# Patient Record
Sex: Female | Born: 1975 | Race: White | Hispanic: No | Marital: Married | Smoking: Never smoker
Health system: Southern US, Community
[De-identification: ages and names within clinical notes are randomized; demographics above are authoritative.]

## PROBLEM LIST (undated history)

## (undated) DIAGNOSIS — Z789 Other specified health status: Secondary | ICD-10-CM

## (undated) HISTORY — PX: NO PAST SURGERIES: SHX2092

## (undated) HISTORY — DX: Other specified health status: Z78.9

---

## 2019-05-16 ENCOUNTER — Ambulatory Visit (INDEPENDENT_AMBULATORY_CARE_PROVIDER_SITE_OTHER): Payer: 59 | Admitting: Advanced Practice Midwife

## 2019-05-16 ENCOUNTER — Other Ambulatory Visit: Payer: Self-pay

## 2019-05-16 ENCOUNTER — Other Ambulatory Visit (HOSPITAL_COMMUNITY)
Admission: RE | Admit: 2019-05-16 | Discharge: 2019-05-16 | Disposition: A | Discharge status: Home / Self Care | Payer: 59 | Source: Ambulatory Visit | Attending: Advanced Practice Midwife | Admitting: Advanced Practice Midwife

## 2019-05-16 ENCOUNTER — Encounter: Payer: Self-pay | Admitting: Advanced Practice Midwife

## 2019-05-16 VITALS — BP 104/64 | Ht 66.0 in | Wt 149.0 lb

## 2019-05-16 DIAGNOSIS — Z124 Encounter for screening for malignant neoplasm of cervix: Secondary | ICD-10-CM

## 2019-05-16 DIAGNOSIS — Z01419 Encounter for gynecological examination (general) (routine) without abnormal findings: Secondary | ICD-10-CM | POA: Diagnosis present

## 2019-05-16 DIAGNOSIS — N6315 Unspecified lump in the right breast, overlapping quadrants: Secondary | ICD-10-CM

## 2019-05-16 NOTE — Progress Notes (Signed)
Gynecology Annual Exam  PCP: Patient, No Pcp Per  Chief Complaint:  Chief Complaint  Patient presents with  . Gynecologic Exam    Rigth breast lump, rather large    History of Present Illness: Patient is a 43 y.o. G4P4 presents for annual exam. The patient has complaint today of right breast lump. She has noticed the lump for some time- unsure exactly- but in the past week noticed it being larger. She denies trauma or tenderness. She denies any discharge from her nipple. She has no other concerns today.   She has not had any bleeding while on Mirena- current IUD placed in 2017. She has no concerns with the IUD.   LMP: No LMP recorded. (Menstrual status: IUD).  Postcoital Bleeding: no Dysmenorrhea: not applicable   The patient is sexually active. She currently uses IUD for contraception. She denies dyspareunia.  The patient does perform self breast exams.  There is no notable family history of breast or ovarian cancer in her family.  The patient wears seatbelts: yes.   The patient has regular exercise: She runs 3-4 miles daily and also works out on [employer]. She admits healthy diet and adequate hydration and sleep.    The patient denies current symptoms of depression.    Review of Systems: Review of Systems  Constitutional: Negative.   HENT: Negative.   Eyes: Negative.   Respiratory: Negative.   Cardiovascular: Negative.   Gastrointestinal: Negative.   Genitourinary: Negative.   Musculoskeletal: Negative.   Skin: Negative.   Neurological: Negative.   Endo/Heme/Allergies: Negative.   Psychiatric/Behavioral: Negative.   Breast: lump in right breast  Past Medical History:  Past Medical History:  Diagnosis Date  . No pertinent past medical history     Past Surgical History:  Past Surgical History:  Procedure Laterality Date  . NO PAST SURGERIES      Gynecologic History:  No LMP recorded. (Menstrual status: IUD). Contraception: IUD Last Pap: 3 years ago  Results were:  no abnormalities  Last mammogram: has never had a mammogram  Obstetric History: 884P4  Family History:  History reviewed. No pertinent family history.  Social History:  Social History   Socioeconomic History  . Marital status: Married    Spouse name: Not on file  . Number of children: Not on file  . Years of education: Not on file  . Highest education level: Not on file  Occupational History  . Occupation: [employer]  Social Needs  . Financial resource strain: Not on file  . Food insecurity    Worry: Not on file    Inability: Not on file  . Transportation needs    Medical: Not on file    Non-medical: Not on file  Tobacco Use  . Smoking status: Never Smoker  . Smokeless tobacco: Never Used  Substance and Sexual Activity  . Alcohol use: Yes    Comment: Rare  . Drug use: Never  . Sexual activity: Yes    Partners: Male    Birth control/protection: I.U.D.  Lifestyle  . Physical activity    Days per week: Not on file    Minutes per session: Not on file  . Stress: Not on file  Relationships  . Social Musicianconnections    Talks on phone: Not on file    Gets together: Not on file    Attends religious service: Not on file    Active member of club or organization: Not on file    Attends meetings of clubs  or organizations: Not on file    Relationship status: Not on file  . Intimate partner violence    Fear of current or ex partner: Not on file    Emotionally abused: Not on file    Physically abused: Not on file    Forced sexual activity: Not on file  Other Topics Concern  . Not on file  Social History Narrative  . Not on file    Allergies:  No Known Allergies  Medications: Prior to Admission medications   Medication Sig Start Date End Date Taking? Authorizing Provider  levonorgestrel (MIRENA) 20 MCG/24HR IUD 1 each by Intrauterine route once.   Yes [provider]    Physical Exam Vitals: Blood pressure 104/64, height 5\' 6"  (1.676 m), weight  149 lb (67.6 kg).  General: NAD HEENT: normocephalic, anicteric Thyroid: no enlargement, no palpable nodules Pulmonary: No increased work of breathing, CTAB Cardiovascular: RRR, distal pulses 2+ Breast: Right breast: 4cm x 2cm nodule just under and superior to nipple, non- tender to palpation, mobile, nipple is pointing down, no nipple discharge. Left breast: no palpable nodules, no tenderness, no nipple discharge.  No axillary or supraclavicular lymphadenopathy. Abdomen: NABS, soft, non-tender, non-distended.  Umbilicus without lesions.  No hepatomegaly, splenomegaly or masses palpable. No evidence of hernia  Genitourinary:  External: Normal external female genitalia.  Normal urethral meatus, normal Bartholin's and Skene's glands.    Vagina: Normal vaginal mucosa, no evidence of prolapse.    Cervix: Grossly normal in appearance, no bleeding, no CMT, strings visible appear to be 3 cm  Uterus: Non-enlarged, mobile, normal contour.    Adnexa: ovaries non-enlarged, no adnexal masses  Rectal: deferred  Lymphatic: no evidence of inguinal lymphadenopathy Extremities: no edema, erythema, or tenderness Neurologic: Grossly intact Psychiatric: mood appropriate, affect full    Assessment: 43 y.o. G4P4 routine annual exam  Plan: Problem List Items Addressed This Visit    None    Visit Diagnoses    Well woman exam with routine gynecological exam    -  Primary   Relevant Orders   Cytology - PAP   MM DIAG BREAST TOMO BILATERAL   US BREAST LTD UNI LEFT INC AXILLA   US BREAST LTD UNI RIGHT INC AXILLA   Cervical cancer screening       Relevant Orders   Cytology - PAP   Breast lump on right side at 12 o'clock position       Relevant Orders   MM DIAG BREAST TOMO BILATERAL   US BREAST LTD UNI LEFT INC AXILLA   US BREAST LTD UNI RIGHT INC AXILLA      1) Mammogram - recommend yearly screening mammogram.  1) Mammogram: diagnostic mammogram/bilateral u/s was ordered today  2) STI screening   was offered and declined  3) ASCCP guidelines and rationale discussed.  Patient opts for every 3 years screening interval  4) Contraception - the patient is currently using  IUD.  She is happy with her current form of contraception and plans to continue  5) Colonoscopy -- Screening recommended starting at age 26 for average risk individuals, age 65 for individuals deemed at increased risk (including African Americans) and recommended to continue until age 68.  For patient age 15-85 individualized approach is recommended.  Gold standard screening is via colonoscopy, Cologuard screening is an acceptable alternative for patient unwilling or unable to undergo colonoscopy.  "Colorectal cancer screening for average?risk adults: 2018 guideline update from the American Cancer Society"CA: A Cancer Journal for Clinicians:  Jan 06, 2017   6) Routine healthcare maintenance including cholesterol, diabetes screening discussed Declines  7) Return in about 1 year (around 05/15/2020) for annual established gyn.   Tresea Mall, CNM [facility] 05/16/2019, 4:38 PM

## 2019-05-17 ENCOUNTER — Telehealth: Payer: Self-pay | Admitting: Advanced Practice Midwife

## 2019-05-17 NOTE — Telephone Encounter (Signed)
Called and spoke with patient, she is aware of her appointment at [facility] on Tuesday May 23, 2019 @ 2:40pm.

## 2019-05-23 ENCOUNTER — Ambulatory Visit
Admission: RE | Admit: 2019-05-23 | Discharge: 2019-05-23 | Disposition: A | Discharge status: Home / Self Care | Payer: 59 | Source: Ambulatory Visit | Attending: Advanced Practice Midwife | Admitting: Advanced Practice Midwife

## 2019-05-23 DIAGNOSIS — N6315 Unspecified lump in the right breast, overlapping quadrants: Secondary | ICD-10-CM

## 2019-05-23 DIAGNOSIS — Z01419 Encounter for gynecological examination (general) (routine) without abnormal findings: Secondary | ICD-10-CM

## 2019-05-23 LAB — CYTOLOGY - PAP
Comment: NEGATIVE
Diagnosis: NEGATIVE
High risk HPV: NEGATIVE

## 2019-05-24 ENCOUNTER — Other Ambulatory Visit: Payer: Self-pay | Admitting: Advanced Practice Midwife

## 2019-05-24 DIAGNOSIS — R928 Other abnormal and inconclusive findings on diagnostic imaging of breast: Secondary | ICD-10-CM

## 2019-05-24 DIAGNOSIS — N6310 Unspecified lump in the right breast, unspecified quadrant: Secondary | ICD-10-CM

## 2019-05-24 DIAGNOSIS — N6001 Solitary cyst of right breast: Secondary | ICD-10-CM

## 2019-05-24 DIAGNOSIS — R921 Mammographic calcification found on diagnostic imaging of breast: Secondary | ICD-10-CM

## 2019-06-02 ENCOUNTER — Other Ambulatory Visit: Payer: Self-pay | Admitting: Advanced Practice Midwife

## 2019-06-02 ENCOUNTER — Ambulatory Visit
Admission: RE | Admit: 2019-06-02 | Discharge: 2019-06-02 | Disposition: A | Discharge status: Home / Self Care | Payer: 59 | Source: Ambulatory Visit | Attending: Advanced Practice Midwife | Admitting: Advanced Practice Midwife

## 2019-06-02 DIAGNOSIS — N6001 Solitary cyst of right breast: Secondary | ICD-10-CM | POA: Insufficient documentation

## 2019-06-02 DIAGNOSIS — R928 Other abnormal and inconclusive findings on diagnostic imaging of breast: Secondary | ICD-10-CM

## 2019-06-02 DIAGNOSIS — R921 Mammographic calcification found on diagnostic imaging of breast: Secondary | ICD-10-CM

## 2019-06-02 DIAGNOSIS — N6019 Diffuse cystic mastopathy of unspecified breast: Secondary | ICD-10-CM

## 2019-06-02 DIAGNOSIS — N6310 Unspecified lump in the right breast, unspecified quadrant: Secondary | ICD-10-CM

## 2019-06-02 HISTORY — PX: BREAST BIOPSY: SHX20

## 2019-06-02 HISTORY — DX: Diffuse cystic mastopathy of unspecified breast: N60.19

## 2019-06-06 LAB — SURGICAL PATHOLOGY

## 2020-11-05 ENCOUNTER — Other Ambulatory Visit: Payer: Self-pay

## 2020-11-05 ENCOUNTER — Other Ambulatory Visit: Payer: Self-pay | Admitting: Chiropractic Medicine

## 2020-11-05 DIAGNOSIS — M9903 Segmental and somatic dysfunction of lumbar region: Secondary | ICD-10-CM

## 2020-11-05 DIAGNOSIS — M5442 Lumbago with sciatica, left side: Secondary | ICD-10-CM

## 2020-11-05 DIAGNOSIS — M62830 Muscle spasm of back: Secondary | ICD-10-CM

## 2020-11-07 ENCOUNTER — Other Ambulatory Visit: Payer: Self-pay

## 2020-11-07 ENCOUNTER — Ambulatory Visit
Admission: RE | Admit: 2020-11-07 | Discharge: 2020-11-07 | Disposition: A | Discharge status: Home / Self Care | Payer: 59 | Source: Ambulatory Visit | Attending: Chiropractic Medicine | Admitting: Chiropractic Medicine

## 2020-11-07 DIAGNOSIS — M62830 Muscle spasm of back: Secondary | ICD-10-CM | POA: Insufficient documentation

## 2020-11-07 DIAGNOSIS — M9903 Segmental and somatic dysfunction of lumbar region: Secondary | ICD-10-CM | POA: Insufficient documentation

## 2020-11-07 DIAGNOSIS — M5442 Lumbago with sciatica, left side: Secondary | ICD-10-CM | POA: Diagnosis present

## 2023-02-01 IMAGING — MR MR LUMBAR SPINE W/O CM
5 series · 32 of 48 positions shown · non-contrast
Comparison: None.

CLINICAL DATA: Low back pain with left leg pain and numbness for
the past month.

EXAM:
MRI LUMBAR SPINE WITHOUT CONTRAST
TECHNIQUE: Multiplanar, multisequence MR imaging of the lumbar spine was
performed. No intravenous contrast was administered.

[Series 5: T2 · sagittal · 4.0mm · 0.81mm/px · 7 of 17 slices shown (1 of 2)]
[im 1/17]
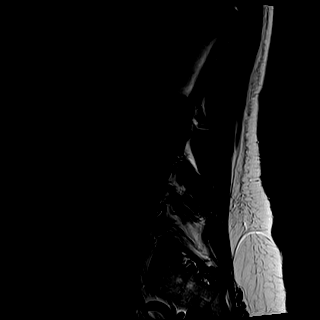
[im 3/17]
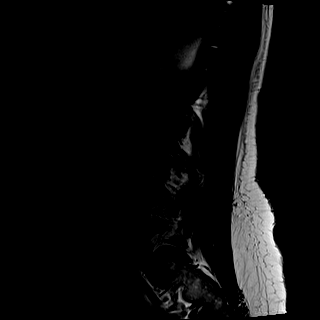
[im 6/17]
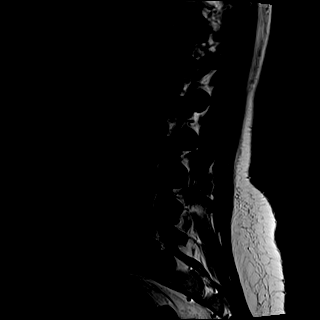
[im 9/17]
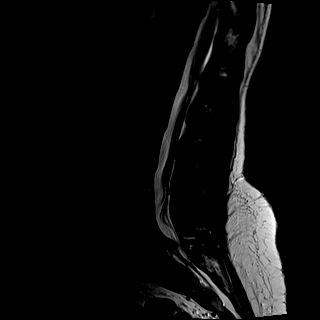
[im 11/17]
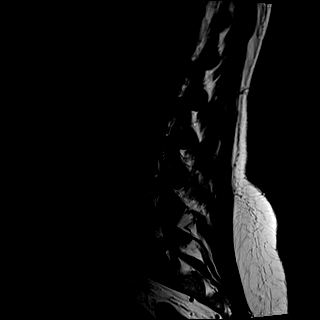
[im 14/17]
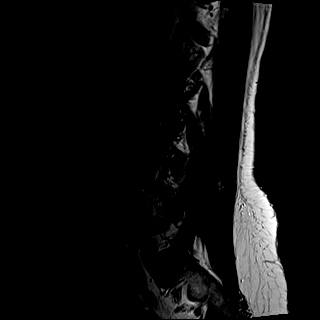
[im 17/17]
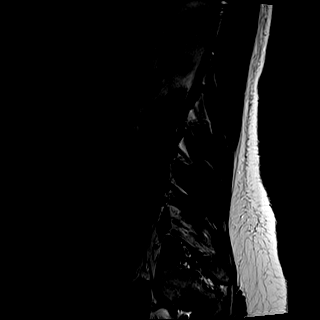

[Series 6: T1 · sagittal · 4.0mm · 0.81mm/px · 7 of 17 slices shown (1 of 2)]
[im 1/17]
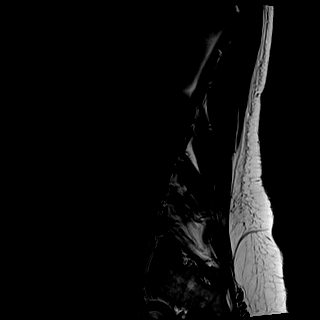
[im 3/17]
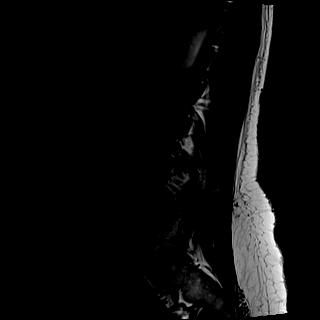
[im 6/17]
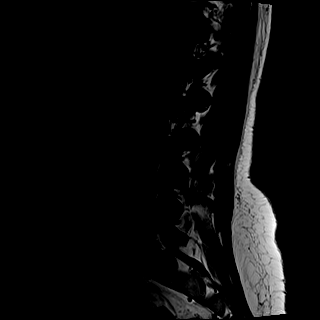
[im 9/17]
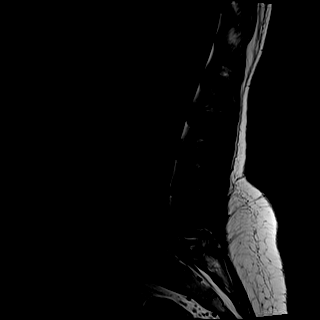
[im 11/17]
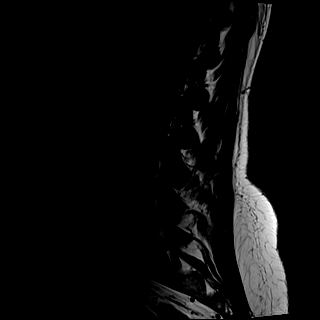
[im 14/17]
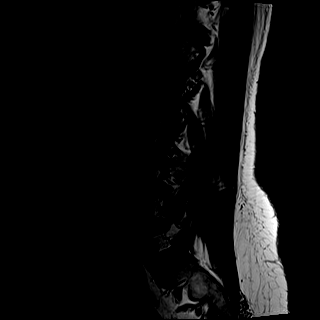
[im 17/17]
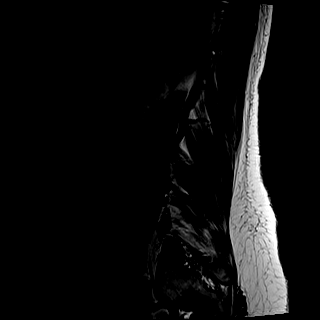

[Series 7: STIR · sagittal · 4.0mm · 0.41mm/px · 2 of 17 slices shown]
[im 1/17]
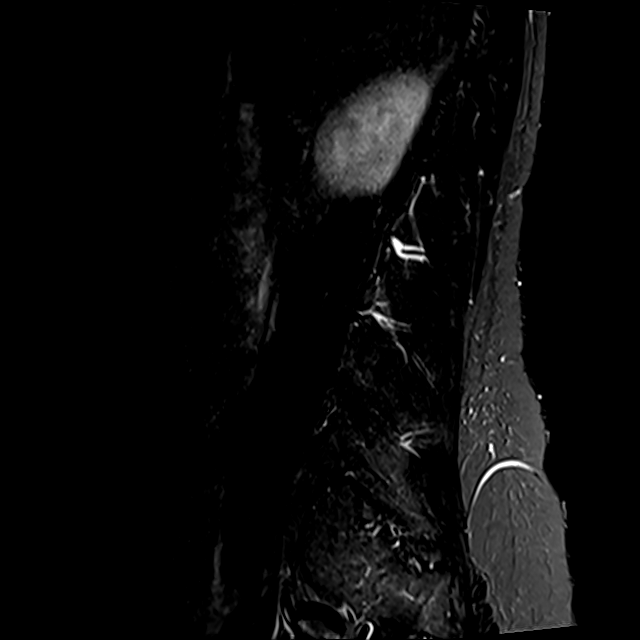
[im 4/17]
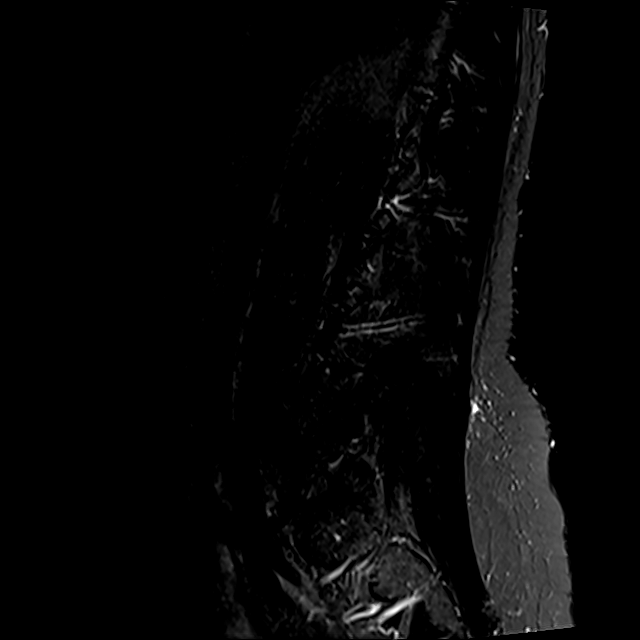

[Series 8: T2 · axial · 4.0mm · 0.78mm/px · z∈[-98,+129]mm · 8 of 38 slices shown (2 of 2)]
[im 1/38]
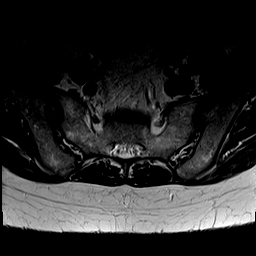
[im 6/38]
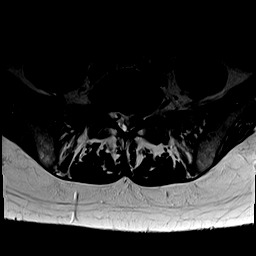
[im 12/38]
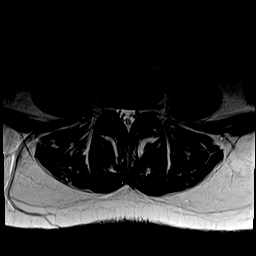
[im 18/38]
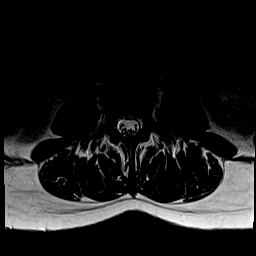
[im 20/38]
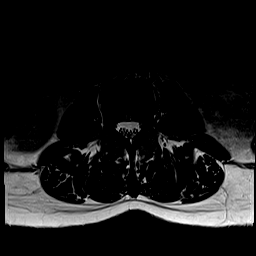
[im 26/38]
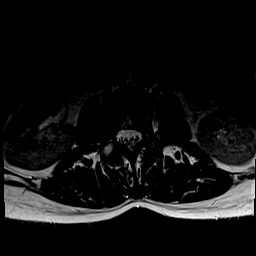
[im 32/38]
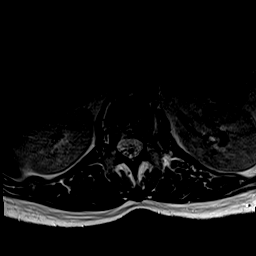
[im 38/38]
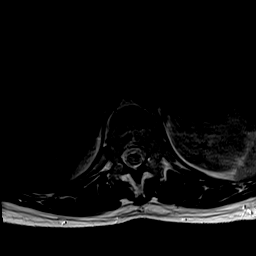

[Series 9: T1 · axial · 4.0mm · 0.39mm/px · z∈[-98,+129]mm · 8 of 38 slices shown (2 of 2)]
[im 1/38]
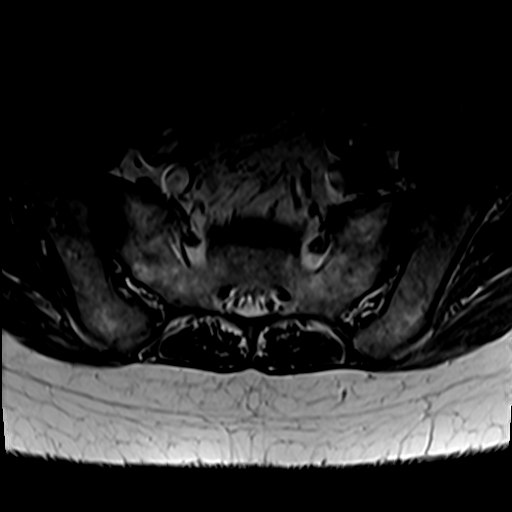
[im 6/38]
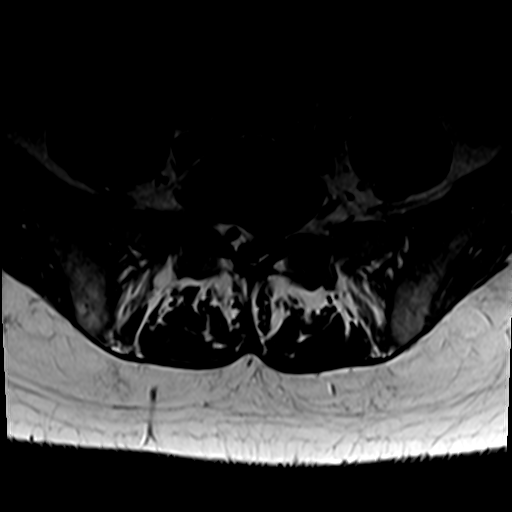
[im 12/38]
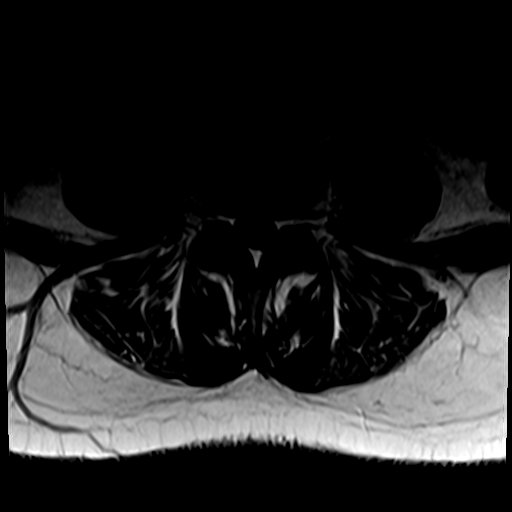
[im 18/38]
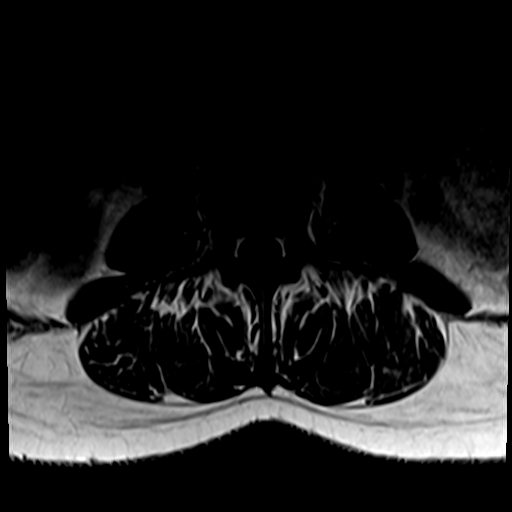
[im 20/38]
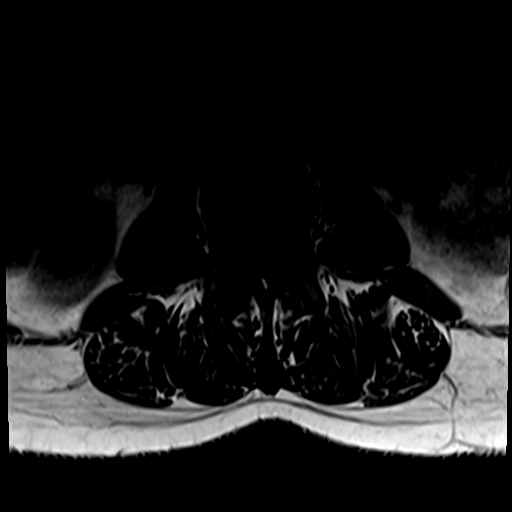
[im 26/38]
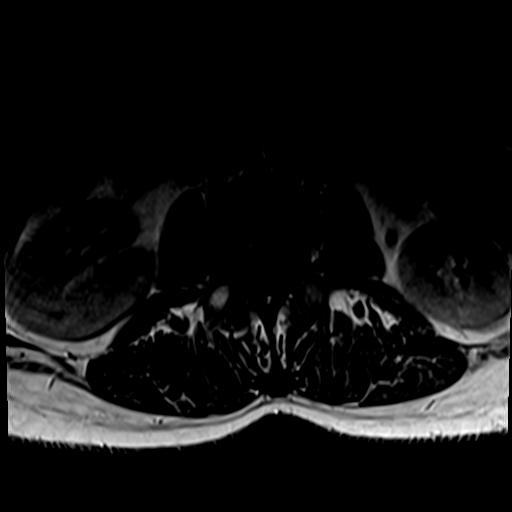
[im 32/38]
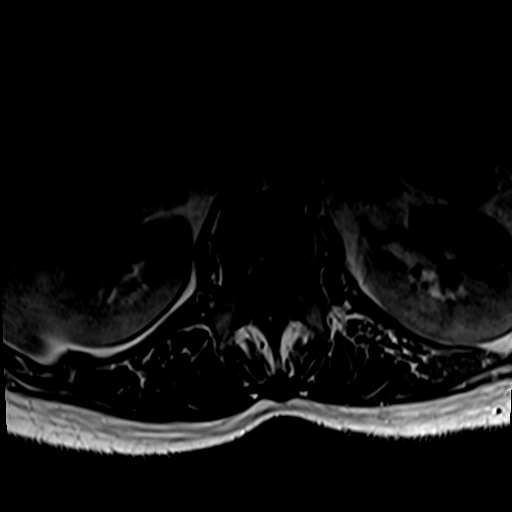
[im 38/38]
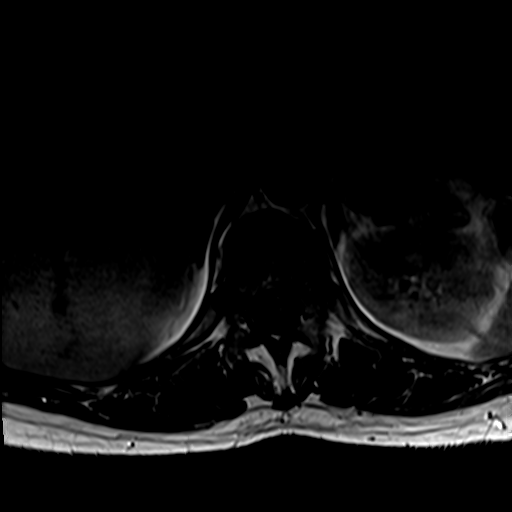

[32 of 48 positions shown; findings below may reference images not displayed]

FINDINGS: Segmentation: Assumed standard. The last well-formed disc space is
designated L5-S1 for the purposes of this report.

Alignment:  Trace retrolisthesis at L3-L4.

Vertebrae:  No fracture, evidence of discitis, or bone lesion.

Conus medullaris and cauda equina: Conus extends to the L1-L2 level.
Conus and cauda equina appear normal.

Paraspinal and other soft tissues: Negative.

Disc levels:

T12-L1:  Negative.

L1-L2:  Negative.

L2-L3:  Minimal disc bulging.  No stenosis.

L3-L4: Mild disc bulging. Mild right neuroforaminal stenosis. No
spinal canal or left neuroforaminal stenosis.

L4-L5:  Negative.

L5-S1: Broad-based posterior disc protrusion with superimposed large
left subarticular disc extrusion impinging on the descending left S1
nerve root. Mild spinal canal stenosis. Severe left and mild right
lateral recess stenosis. No neuroforaminal stenosis.
IMPRESSION: 1. Large left subarticular disc extrusion at L5-S1 impinging on the
descending left S1 nerve root.

## 2024-08-24 ENCOUNTER — Encounter: Payer: Self-pay | Admitting: Advanced Practice Midwife

## 2024-08-24 NOTE — Patient Instructions (Incomplete)
 Preventive Care 50-50 Years Old, Female Preventive care refers to lifestyle choices and visits with your health care provider that can promote health and wellness. Preventive care visits are also called wellness exams. What can I expect for my preventive care visit? Counseling Your health care provider may ask you questions about your: Medical history, including: Past medical problems. Family medical history. Pregnancy history. Current health, including: Menstrual cycle. Method of birth control. Emotional well-being. Home life and relationship well-being. Sexual activity and sexual health. Lifestyle, including: Alcohol, nicotine or tobacco, and drug use. Access to firearms. Diet, exercise, and sleep habits. Work and work astronomer. Sunscreen use. Safety issues such as seatbelt and bike helmet use. Physical exam Your health care provider will check your: Height and weight. These may be used to calculate your BMI (body mass index). BMI is a measurement that tells if you are at a healthy weight. Waist circumference. This measures the distance around your waistline. This measurement also tells if you are at a healthy weight and may help predict your risk of certain diseases, such as type 2 diabetes and high blood pressure. Heart rate and blood pressure. Body temperature. Skin for abnormal spots. What immunizations do I need?  Vaccines are usually given at various ages, according to a schedule. Your health care provider will recommend vaccines for you based on your age, medical history, and lifestyle or other factors, such as travel or where you work. What tests do I need? Screening Your health care provider may recommend screening tests for certain conditions. This may include: Lipid and cholesterol levels. Diabetes screening. This is done by checking your blood sugar (glucose) after you have not eaten for a while (fasting). Pelvic exam and Pap test. Hepatitis B test. Hepatitis C  test. HIV (human immunodeficiency virus) test. STI (sexually transmitted infection) testing, if you are at risk. Lung cancer screening. Colorectal cancer screening. Mammogram. Talk with your health care provider about when you should start having regular mammograms. This may depend on whether you have a family history of breast cancer. BRCA-related cancer screening. This may be done if you have a family history of breast, ovarian, tubal, or peritoneal cancers. Bone density scan. This is done to screen for osteoporosis. Talk with your health care provider about your test results, treatment options, and if necessary, the need for more tests. Follow these instructions at home: Eating and drinking  Eat a diet that includes fresh fruits and vegetables, whole grains, lean protein, and low-fat dairy products. Take vitamin and mineral supplements as recommended by your health care provider. Do not drink alcohol if: Your health care provider tells you not to drink. You are pregnant, may be pregnant, or are planning to become pregnant. If you drink alcohol: Limit how much you have to 0-1 drink a day. Know how much alcohol is in your drink. In the [place] one drink equals one 12 oz bottle of beer (355 mL), one 5 oz glass of wine (148 mL), or one 1 oz glass of hard liquor (44 mL). Lifestyle Brush your teeth every morning and night with fluoride toothpaste. Floss one time each day. Exercise for at least 30 minutes 5 or more days each week. Do not use any products that contain nicotine or tobacco. These products include cigarettes, chewing tobacco, and vaping devices, such as e-cigarettes. If you need help quitting, ask your health care provider. Do not use drugs. If you are sexually active, practice safe sex. Use a condom or other form of protection to  prevent STIs. If you do not wish to become pregnant, use a form of birth control. If you plan to become pregnant, see your health care provider for a  prepregnancy visit. Take aspirin only as told by your health care provider. Make sure that you understand how much to take and what form to take. Work with your health care provider to find out whether it is safe and beneficial for you to take aspirin daily. Find healthy ways to manage stress, such as: Meditation, yoga, or listening to music. Journaling. Talking to a trusted person. Spending time with friends and family. Minimize exposure to UV radiation to reduce your risk of skin cancer. Safety Always wear your seat belt while driving or riding in a vehicle. Do not drive: If you have been drinking alcohol. Do not ride with someone who has been drinking. When you are tired or distracted. While texting. If you have been using any mind-altering substances or drugs. Wear a helmet and other protective equipment during sports activities. If you have firearms in your house, make sure you follow all gun safety procedures. Seek help if you have been physically or sexually abused. What's next? Visit your health care provider once a year for an annual wellness visit. Ask your health care provider how often you should have your eyes and teeth checked. Stay up to date on all vaccines. This information is not intended to replace advice given to you by your health care provider. Make sure you discuss any questions you have with your health care provider. Document Revised: 01/22/2021 Document Reviewed: 01/22/2021 Elsevier Patient Education  2024 Elsevier Inc. How to Do a Breast Self-Exam Doing breast self-exams can help you stay healthy. They're one way to know what's normal for your breasts. They can help you catch a problem while it's still small and can be treated. You need to: Check your breasts often. Tell your doctor about any changes. You should do breast self-exams even if you have breast implants. What you need: A mirror. A well-lit room. A pillow or other soft object. How to do a  breast self-exam Look for changes  Take off all the clothes above your waist. Stand in front of a mirror in a room with good lighting. Put your hands down at your sides. Compare your breasts in the mirror. Look for difference between them, such as: Differences in shape. Differences in size. Wrinkles, dips, and bumps in one breast and not the other. Look at each breast for skin changes, such as: Redness. Scaly spots. Spots where your skin is thicker. Dimpling. Open sores. Look for changes in your nipples, such as: Fluid coming out of a nipple. Fluid around a nipple. Bleeding. Dimpling. Redness. A nipple that looks pushed in or that has changed position. Feel for changes Lie on your back. Feel each breast. To do this: Pick a breast to feel. Place a pillow under the shoulder closest to that breast. Put the arm closest to that breast behind your head. Feel the breast using the hand of your other arm. Use the pads of your three middle fingers to make small circles starting near the nipple. Use light, medium, and firm pressure. Keep making circles, moving down over the breast. Stop when you feel your ribs. Start making circles with your fingers again, this time going up until you reach your collarbone. Then, make circles out across your breast and into your armpit area. Squeeze your nipple. Check for fluid and lumps. Do these steps again  to check your other breast. Sit or stand in the tub or shower. With soapy water on your skin, feel each breast the same way you did when you were lying down. Write down what you find Writing down what you find can help you keep track of what you want to tell your doctor. Write down: What's normal for each breast. Any changes you find. Write down: The kind of change. If your breast feels tender or painful. Any lump you find. Write down its size and where it is. When you last had your period. General tips If you're breastfeeding, the best time  to check your breasts is after you feed your baby or after you use a breast pump. If you get a period, the best time to check your breasts is 5-7 days after your period ends. With time, you'll get more used to doing the self-exam. You'll also start to know if there are changes in your breasts. Contact a doctor if: You see a change in the shape or size of your breasts or nipples. You see a change in the skin of your breast or nipples. You have fluid coming from your nipples that isn't normal. You find a new lump or thick area. You have breast pain. You have any concerns about your breast health. This information is not intended to replace advice given to you by your health care provider. Make sure you discuss any questions you have with your health care provider. Document Revised: 10/06/2023 Document Reviewed: 10/06/2023 Elsevier Patient Education  2025 [employer]. Pap Test: What to Know Why am I having this test? A Pap test, also called a Pap smear, is a screening test to check for signs of: Infection. Cancer of the cervix. The cervix is the lowest part of the uterus. Precancerous changes. These are changes that may be a sign that cancer is developing. Females need this test regularly. In general, you should have a Pap test every 3 years until you reach menopause or you are 50 years old. If you are 43-73 years old you may choose to have their Pap test done at the same time as an human papillomavirus (HPV) test every 5 years instead of every 3 years. Your health care provider may recommend having Pap tests more or less often depending on your medical conditions and past Pap test results. What is being tested? Cervical cells are tested for signs of infection or abnormalities. What kind of sample is taken?  Your provider will collect a sample of cells from the surface of your cervix. This will be done using a small cotton swab, plastic spatula, or brush that is inserted into your vagina  using a tool called a speculum. This sample is often collected during a pelvic exam, when you are lying on your back on an exam table with your feet in footrests, called stirrups. In some cases, fluids (secretions) from the cervix or vagina may also be collected. How do I prepare for this test? Know where you are in your menstrual cycle. If you're menstruating on the day of the test, you may be asked to reschedule. You may need to reschedule if you have a known vaginal infection on the day of the test. Follow instructions from your provider about: Changing or stopping your regular medicines. Some medicines, such as vaginal medicines and tetracycline, can cause abnormal test results. Avoiding douching 2-3 days before or the day of the test. Tell a health care provider about: Any allergies you have.  All medicines you take. These include vitamins, herbs, eye drops, and creams. Any bleeding problems you have. Any surgeries you've had. Any medical problems you have. Whether you're pregnant or may be pregnant. How are the results reported? Your test results will be reported as either abnormal or normal. What do the results mean? A normal test result means that you do not have signs of cancer of the cervix. An abnormal result may mean that you have: Cancer. A Pap test by itself is not enough to diagnose cancer. You will have more tests done if cancer is suspected. Precancerous changes in your cervix. Inflammation of the cervix. A sexually transmitted infection (STI). A fungal infection. An infection from a parasite. Talk with your provider about what your results mean. More tests may be needed. Questions to ask your health care provider Ask your provider, or the department that is doing the test: When will my results be ready? How will I get my results? What are my treatment options? What other tests do I need? What are my next steps? This information is not intended to replace advice  given to you by your health care provider. Make sure you discuss any questions you have with your health care provider. Document Revised: 10/16/2023 Document Reviewed: 10/16/2023 Elsevier Patient Education  2025 [employer].   IUD AFTERCARE INSTRUCTIONS  Today you may go back to school or work after your visit. You must wait 24 hours after your IUD is put in before you can use tampons, take a bath, or have vaginal sex.  You may have more cramps or heavier bleeding with your periods, or spotting between your periods. This is normal. The cramping and bleeding can last for 3-6 months with the Mirena and Kyleena IUDs. After 6 months, the cramping and bleeding should get better. Many women will stop having periods after 1 or 2 years with the Mirena and Kyleena IUDs. If you have the Paragard (copper) IUD, you may have more cramping and more bleeding with your periods as long as you have the IUD inside you.  Ibuprofen helps decrease the bleeding and cramping. You can take as many as 4 pills (800 mg) of Ibuprofen every 8 hours with food (each pill contains 200 mg).  Your IUD may come out by itself in the first three months. If you can feel the strings, the IUD is in the right place. If your IUD comes out, you can become pregnant immediately. If you are not sure how to check the strings, we can help you. Meanwhile, use condoms.  Your IUD does not protect against sexually transmitted infections including the HIV virus, genital warts (HPV), gonorrhea, chlamydia, trichomonas, syphilis and herpes. Condoms should be used to decrease the risk of sexually transmitted infections. If you think that you have been exposed to a sexually transmitted infection, please call the clinic. Most infections can be treated WITHOUT removing your IUD.  If you had your IUD placed for birth control, it is effective immediately if it was inserted within five days after the start of your period. If you have it inserted at any  other time during your menstrual cycle, use another method of birth control, like condoms for at least 7 days.  Warning Signs Call the clinic if any of the following occurs:  You have fever (over 101F ) or chills.  The implant comes out or you have concerns about its location.  You have a positive pregnancy test or suspect you might be pregnant.

## 2024-08-24 NOTE — Progress Notes (Unsigned)
 "  Gynecology Annual Exam  PCP: [facility], [place]  Chief Complaint: No chief complaint on file.   History of Present Illness: Patient is a 49 y.o. G4P4 presents for annual exam. The patient has no complaints today.   LMP: No LMP recorded. (Menstrual status: IUD). Average Interval: {Desc; regular/irreg:14544}, {numbers 22-35:14824} days Duration of flow: {numbers; 0-10:33138} days Heavy Menses: {yes/no:63} Clots: {yes/no:63} Intermenstrual Bleeding: {yes/no:63} Postcoital Bleeding: {yes/no:63} Dysmenorrhea: {yes/no:63}   The patient {sys sexually active:13135} sexually active. She currently uses IUD for contraception. She {has/denies:315300} dyspareunia.  The patient {DOES_DOES WNU:81435} perform self breast exams.  There {is/is no:19420} notable family history of breast or ovarian cancer in her family.  The patient wears seatbelts: {yes/no:63}.   The patient has regular exercise: {yes/no/not asked:9010}.    The patient {Blank single:19197::reports,denies} current symptoms of depression.    Review of Systems: ROS  Past Medical History:  There are no active problems to display for this patient.   Past Surgical History:  Past Surgical History:  Procedure Laterality Date   BREAST BIOPSY Right 06/02/2019   rt stereo heart   NO PAST SURGERIES      Gynecologic History:  No LMP recorded. (Menstrual status: IUD).  Last Pap: 05/16/19 Results were:  NIL and HR HPV negative  Last mammogram: 05/23/19 Results were: BI-RADS IV  Obstetric History: G4P4  Family History:  No family history on file.  Social History:  Social History   Socioeconomic History   Marital status: Married    Spouse name: Not on file   Number of children: Not on file   Years of education: Not on file   Highest education level: Not on file  Occupational History   Occupation: [employer]  Tobacco Use   Smoking status: Never   Smokeless tobacco: Never  Vaping Use   Vaping status: Never Used   Substance and Sexual Activity   Alcohol use: Yes    Comment: Rare   Drug use: Never   Sexual activity: Yes    Partners: Male    Birth control/protection: I.U.D.  Other Topics Concern   Not on file  Social History Narrative   Not on file   Social Drivers of Health   Tobacco Use: Not on file  Financial Resource Strain: Not on file  Food Insecurity: Not on file  Transportation Needs: Not on file  Physical Activity: Not on file  Stress: Not on file  Social Connections: Not on file  Intimate Partner Violence: Not on file  Depression (EYV7-0): Not on file  Alcohol Screen: Not on file  Housing: Not on file  Utilities: Not on file  Health Literacy: Not on file    Allergies:  Allergies[1]  Medications: Prior to Admission medications  Medication Sig Start Date End Date Taking? Authorizing Provider  levonorgestrel (MIRENA) 20 MCG/24HR IUD 1 each by Intrauterine route once.    [provider]    Physical Exam Vitals: There were no vitals taken for this visit.  General: NAD HEENT: normocephalic, anicteric Thyroid: no enlargement, no palpable nodules Pulmonary: No increased work of breathing, CTAB Cardiovascular: RRR, distal pulses 2+ Breast: Breast symmetrical, no tenderness, no palpable nodules or masses, no skin or nipple retraction present, no nipple discharge.  No axillary or supraclavicular lymphadenopathy. Abdomen: NABS, soft, non-tender, non-distended.  Umbilicus without lesions.  No hepatomegaly, splenomegaly or masses palpable. No evidence of hernia  Genitourinary:  External: Normal external female genitalia.  Normal urethral meatus, normal Bartholin's and Skene's glands.  Vagina: Normal vaginal mucosa, no evidence of prolapse.    Cervix: Grossly normal in appearance, no bleeding  Uterus: Non-enlarged, mobile, normal contour.  No CMT  Adnexa: ovaries non-enlarged, no adnexal masses  Rectal: deferred  Lymphatic: no evidence of inguinal  lymphadenopathy Extremities: no edema, erythema, or tenderness Neurologic: Grossly intact Psychiatric: mood appropriate, affect full   Assessment: 49 y.o. G4P4 routine annual exam  GYNECOLOGY OFFICE PROCEDURE NOTE  @NAME @ is a @AGE @ @GP @ here for IUD removal and reinsertion. The patient currently has a Mirena IUD placed in 2017, which will be replaced with a Mirena IUD today.  No GYN concerns.  Last pap smear was on *** and was normal.  IUD Removal and Reinsertion  Patient identified, informed consent performed, consent signed.   Discussed risks of irregular bleeding, cramping, infection, malpositioning or uterine perforation of the IUD which may require further procedures. Time out was performed. Speculum placed in the vagina. The strings of the IUD were grasped and pulled using ring forceps. The IUD was successfully removed in its entirety. The cervix was cleaned with Betadine x 2 and grasped anteriorly with a single tooth tenaculum.  The uterus was sounded to IUD insertion apparatus was used to sound the uterus to *** cm using a uterine sound.  The IUD was then placed per manufacturer's recommendations. Strings trimmed to 3 cm. Tenaculum was removed, good hemostasis noted. Patient tolerated procedure well.   Patient was given post-procedure instructions.  Patient was also asked to check IUD strings periodically and follow up in 6 weeks for IUD check.   @MECREDENTIALS @ @TD @ @NOW @   IUD insertion CPT 58300,  Skyla J7301 Mirena J7298 Liletta J7297 Paraguard J7300 Josette G2703 IUD remval 41698 Modifer 25, plus Modifer 79 is done during a global billing visit   Plan: Problem List Items Addressed This Visit   None   1) Mammogram - recommend yearly screening mammogram.  Mammogram Was ordered today   2) STI screening  {Blank single:19197::was,was not}offered and {Blank single:19197::accepted,declined,therefore not obtained}  3) ASCCP guidelines and rational discussed.   Patient opts for {Blank single:19197::***,every 5 years,every 3 years,yearly,discontinue age >65,discontinue secondary to prior hysterectomy} screening interval  4) Contraception - the patient is currently using  IUD.  She is {Blank single:19197::happy with her current form of contraception and plans to continue,interested in changing to ***,interested in starting Contraception: ***,not currently in need of contraception secondary to being sterile,attempting to conceive in the near future}  5) Colonoscopy -- Screening recommended starting at age 76 for average risk individuals, age 22 for individuals deemed at increased risk (including African Americans) and recommended to continue until age 98.  For patient age 43-85 individualized approach is recommended.  Gold standard screening is via colonoscopy, Cologuard screening is an acceptable alternative for patient unwilling or unable to undergo colonoscopy.  Colorectal cancer screening for average?risk adults: 2018 guideline update from the American Cancer SocietyCA: A Cancer Journal for Clinicians: Jan 06, 2017   6) Routine healthcare maintenance including cholesterol, diabetes screening discussed {Blank single:19197::managed by PCP,Ordered today,To return fasting at a later date,Declines}  7) No follow-ups on file.   Slater Rains, CNM [facility] 08/24/2024 11:05 AM             [1] No Known Allergies  "

## 2024-08-25 ENCOUNTER — Other Ambulatory Visit (HOSPITAL_COMMUNITY)
Admission: RE | Admit: 2024-08-25 | Discharge: 2024-08-25 | Disposition: A | Source: Ambulatory Visit | Attending: Advanced Practice Midwife | Admitting: Advanced Practice Midwife

## 2024-08-25 ENCOUNTER — Encounter: Payer: Self-pay | Admitting: Advanced Practice Midwife

## 2024-08-25 ENCOUNTER — Ambulatory Visit: Admitting: Advanced Practice Midwife

## 2024-08-25 VITALS — BP 127/88 | HR 96 | Ht 66.0 in | Wt 179.6 lb

## 2024-08-25 DIAGNOSIS — Z124 Encounter for screening for malignant neoplasm of cervix: Secondary | ICD-10-CM | POA: Diagnosis present

## 2024-08-25 DIAGNOSIS — Z30432 Encounter for removal of intrauterine contraceptive device: Secondary | ICD-10-CM | POA: Diagnosis not present

## 2024-08-25 DIAGNOSIS — Z01419 Encounter for gynecological examination (general) (routine) without abnormal findings: Secondary | ICD-10-CM | POA: Insufficient documentation

## 2024-08-25 DIAGNOSIS — Z30433 Encounter for removal and reinsertion of intrauterine contraceptive device: Secondary | ICD-10-CM

## 2024-08-25 DIAGNOSIS — Z1331 Encounter for screening for depression: Secondary | ICD-10-CM

## 2024-08-25 DIAGNOSIS — Z1231 Encounter for screening mammogram for malignant neoplasm of breast: Secondary | ICD-10-CM

## 2024-08-25 NOTE — Progress Notes (Signed)
"  ° ° °  GYNECOLOGY OFFICE PROCEDURE NOTE   Terri Keller is a 49 y.o. G4P4 here for IUD removal. The patient currently has a Mirena IUD placed in 2017.  No GYN concerns.  Last pap smear was on 05/16/19 and was normal. PAP smear done today.   IUD Removal  Patient identified, informed consent performed, consent signed.  Time out was performed. Speculum placed in the vagina. The strings of the IUD were grasped and pulled using ring forceps. The IUD was successfully removed in its entirety. Patient tolerated procedure well.     Slater Rains, CNM 08/25/2024 4:35 PM     "

## 2024-08-28 ENCOUNTER — Ambulatory Visit

## 2024-08-28 DIAGNOSIS — D489 Neoplasm of uncertain behavior, unspecified: Secondary | ICD-10-CM | POA: Diagnosis not present

## 2024-08-28 DIAGNOSIS — L578 Other skin changes due to chronic exposure to nonionizing radiation: Secondary | ICD-10-CM

## 2024-08-28 DIAGNOSIS — D2239 Melanocytic nevi of other parts of face: Secondary | ICD-10-CM

## 2024-08-28 DIAGNOSIS — D229 Melanocytic nevi, unspecified: Secondary | ICD-10-CM

## 2024-08-28 DIAGNOSIS — L814 Other melanin hyperpigmentation: Secondary | ICD-10-CM

## 2024-08-28 DIAGNOSIS — W908XXA Exposure to other nonionizing radiation, initial encounter: Secondary | ICD-10-CM | POA: Diagnosis not present

## 2024-08-28 NOTE — Patient Instructions (Addendum)
 Wound Care Instructions  Cleanse wound gently with soap and water once a day then pat dry with clean gauze. Apply a thin coat of Petrolatum (petroleum jelly, Vaseline) over the wound (unless you have an allergy to this). We recommend that you use a new, sterile tube of Vaseline. Do not pick or remove scabs. Do not remove the yellow or white healing tissue from the base of the wound.  Cover the wound with fresh, clean, nonstick gauze and secure with paper tape. You may use Band-Aids in place of gauze and tape if the wound is small enough, but would recommend trimming much of the tape off as there is often too much. Sometimes Band-Aids can irritate the skin.  You should call the office for your biopsy report after 1 week if you have not already been contacted.  If you experience any problems, such as abnormal amounts of bleeding, swelling, significant bruising, significant pain, or evidence of infection, please call the office immediately.  FOR ADULT SURGERY PATIENTS: If you need something for pain relief you may take 1 extra strength Tylenol (acetaminophen) AND 2 Ibuprofen (200mg  each) together every 4 hours as needed for pain. (do not take these if you are allergic to them or if you have a reason you should not take them.) Typically, you may only need pain medication for 1 to 3 days.   Scar Handout  A scar forms as a normal part of the skins healing process. Scars may form from surgery, trauma (accidents, burns, etc.), infections such as chickenpox, or inflammation in the skin such as acne.   Types of scars Scars may be flat, indented, or raised. Scars may be lighter or darker in color, pink or red. Hypertrophic scars and keloid scars form when an excessive amount of scar tissue is formed, making the scar more raised than usual. A hypertrophic scar is an exaggerated type of scar but stays within the edges of the injury. A keloid scar spills over beyond the edges of the injury and may grow much  like a tumor.  The type of scar depends on many factors including age, location on body, and skin type. In time, most scars become less noticeable, but scars do not go away completely.   When to seek treatment for a scar: In most people, scars do not cause significant problems. Patients may seek treatment for scars for different reasons: -Emotional/social. Scars may develop on very visible areas of the body, or may be a reminder of a traumatic event. Scars in these situations may cause significant stress.  -Symptoms. Some scars may cause symptoms such as pain, itching, tingling, or numbness.  -Limitation of function. Some scars may limit activities by making it hard to move the body part with the scar.   What treatments are available for scars? Different treatment options are available for scars. The type of treatment depends on the type of scar, location, age, and how bothersome the scar is. The goal of treatment is to make a scar feel and look better. Treatments do not make scars disappear entirely but can help them fade more quickly and become less noticeable. Treatment options include: topical products (creams, ointments, patches), injections, laser, chemical peels, dermabrasion, cryotherapy (freezing with liquid nitrogen), radiation, and surgery.   How to minimize a scar: Scars cannot always be prevented. The following are recommendations for reducing the appearance of scars caused by injury: -Wound care. After a surgery or an injury, follow your doctors wound care instructions closely. This  will allow the best healing possible. Most non-surgical wounds heal best when they are kept clean and covered with a bandage that keeps them from getting too wet or too dry.  -Sun protection. This will prevent darkening of the scar.  -Scar massage. Regular massage of the scar can help to make it softer and flatter.  - Other local measures like downward pressure with silicone sheet, steri strips, or tape  may also help with scar formation.   Silicone scar reducing sheets or gel:   Use as directed - briefly, if you choose the sheets, cut to size and you can use for up to 1 week.  You can peel off before showering/exercise and then reapply after drying the area. The patch can be washed with mild soap and water and reused until it begins to disintegrate or about 7 days. Replace every week and use for a total of about 8 weeks. Rarely people may develop an allergy to the adhesive used - if this happens, stop using the sheets and switch to the gel - apply 1-2x/day for 8 weeks total.   Brands to try: 1. Generic drugstore brands are just fine-- [facility], [facility], etc.  Just ensure silicone is the main ingredient. 2. Cica-Care Silicone Gel Sheeting:To purchase call [phone] or visit any one of several websites: Guyhumor.tn, VitalityMedical.com, Amazon.com 3. Novagel Silicone Gel Sheets: To purchase visit any of several websites: Amazon.com, AliMed.com, Guyhumor.tn 4. ScarAway: found at most major drug stores and [employer].com   Due to recent changes in healthcare laws, you may see results of your pathology and/or laboratory studies on MyChart before the doctors have had a chance to review them. We understand that in some cases there may be results that are confusing or concerning to you. Please understand that not all results are received at the same time and often the doctors may need to interpret multiple results in order to provide you with the best plan of care or course of treatment. Therefore, we ask that you please give us  2 business days to thoroughly review all your results before contacting the office for clarification. Should we see a critical lab result, you will be contacted sooner.   If You Need Anything After Your Visit  If you have any questions or concerns for your doctor, please call our main line at [phone] and press option 4 to reach your doctor's medical assistant.  If no one answers, please leave a voicemail as directed and we will return your call as soon as possible. Messages left after 4 pm will be answered the following business day.   You may also send us  a message via MyChart. We typically respond to MyChart messages within 1-2 business days.  For prescription refills, please ask your pharmacy to contact our office. Our fax number is [phone].  If you have an urgent issue when the clinic is closed that cannot wait until the next business day, you can page your doctor at the number below.    Please note that while we do our best to be available for urgent issues outside of office hours, we are not available 24/7.   If you have an urgent issue and are unable to reach us , you may choose to seek medical care at your doctor's office, retail clinic, urgent care center, or emergency room.  If you have a medical emergency, please immediately call 911 or go to the emergency department.  Pager Numbers  - Dr. Hester: [phone]  - Dr. Jackquline: [phone]  -  Dr. Claudene: [phone]   - Dr. Raymund: [phone]  In the event of inclement weather, please call our main line at [phone] for an update on the status of any delays or closures.  Dermatology Medication Tips: Please keep the boxes that topical medications come in in order to help keep track of the instructions about where and how to use these. Pharmacies typically print the medication instructions only on the boxes and not directly on the medication tubes.   If your medication is too expensive, please contact our office at [phone] option 4 or send us  a message through MyChart.   We are unable to tell what your co-pay for medications will be in advance as this is different depending on your insurance coverage. However, we may be able to find a substitute medication at lower cost or fill out paperwork to get insurance to cover a needed medication.   If a prior authorization is  required to get your medication covered by your insurance company, please allow us  1-2 business days to complete this process.  Drug prices often vary depending on where the prescription is filled and some pharmacies may offer cheaper prices.  The website www.goodrx.com contains coupons for medications through different pharmacies. The prices here do not account for what the cost may be with help from insurance (it may be cheaper with your insurance), but the website can give you the price if you did not use any insurance.  - You can print the associated coupon and take it with your prescription to the pharmacy.  - You may also stop by our office during regular business hours and pick up a GoodRx coupon card.  - If you need your prescription sent electronically to a different pharmacy, notify our office through [facility] or by phone at [phone] option 4.     Si Usted Necesita Algo Despus de Su Visita  Tambin puede enviarnos un mensaje a travs de Clinical Cytogeneticist. Por lo general respondemos a los mensajes de MyChart en el transcurso de 1 a 2 das hbiles.  Para renovar recetas, por favor pida a su farmacia que se ponga en contacto con nuestra oficina. Randi lakes de fax es [place] [phone].  Si tiene un asunto urgente cuando la clnica est cerrada y que no puede esperar hasta el siguiente da hbil, puede llamar/localizar a su doctor(a) al nmero que aparece a continuacin.   Por favor, tenga en cuenta que aunque hacemos todo lo posible para estar disponibles para asuntos urgentes fuera del horario de [place], no estamos disponibles las 24 horas del da, los 7 [address]  [address] de la [place].   Si tiene un problema urgente y no puede comunicarse con nosotros, puede optar por buscar atencin mdica  en el consultorio de su doctor(a), en una clnica privada, en un centro de atencin urgente o en una sala de emergencias.  Si tiene engineer, drilling, por favor llame inmediatamente al 911 o vaya a  la sala de emergencias.  Nmeros de bper  - Dr. Hester: [phone]  - Dra. Jackquline: [phone]  - Dr. Claudene: [phone]  - Dra. Kitts: [phone]  En caso de inclemencias del [place], por favor llame a nuestra lnea principal al [phone] para una actualizacin sobre el estado de cualquier retraso o cierre.  Consejos para la medicacin en dermatologa: Por favor, guarde las cajas en las que vienen los medicamentos de uso tpico para ayudarle a seguir las instrucciones sobre dnde y cmo usarlos. Las farmacias generalmente imprimen las instrucciones del  medicamento slo en las cajas y no directamente en los tubos del medicamento.   Si su medicamento es muy caro, por favor, pngase en contacto con landry rieger llamando al [phone] y presione la opcin 4 o envenos un mensaje a travs de Clinical Cytogeneticist.   No podemos decirle cul ser su copago por los medicamentos por adelantado ya que esto es diferente dependiendo de la cobertura de su seguro. Sin embargo, es posible que podamos encontrar un medicamento sustituto a audiological scientist un formulario para que el seguro cubra el medicamento que se considera necesario.   Si se requiere una autorizacin previa para que su compaa de seguros cubra su medicamento, por favor permtanos de 1 a 2 das hbiles para completar este proceso.  Los precios de los medicamentos varan con frecuencia dependiendo del environmental consultant de dnde se surte la receta y alguna farmacias pueden ofrecer precios ms baratos.  El sitio web www.goodrx.com tiene cupones para medicamentos de health and safety inspector. Los precios aqu no tienen en cuenta lo que podra costar con la ayuda del seguro (puede ser ms barato con su seguro), pero el sitio web puede darle el precio si no utiliz tourist information centre manager.  - Puede imprimir el cupn correspondiente y llevarlo con su receta a la farmacia.  - Tambin puede pasar por nuestra oficina durante el horario de atencin regular y education officer, museum  una tarjeta de cupones de GoodRx.  - Si necesita que su receta se enve electrnicamente a una farmacia diferente, informe a nuestra oficina a travs de MyChart de [place] o por telfono llamando al [phone] y presione la opcin 4.

## 2024-08-28 NOTE — Progress Notes (Signed)
 "   Subjective   Terri Keller is a 49 y.o. female who presents for the following: Lesion(s) of concern . Patient is new patient  Today patient reports: LOC x2 on the face.   Review of Systems:    No other skin or systemic complaints except as noted in HPI or Assessment and Plan.  The following portions of the chart were reviewed this encounter and updated as appropriate: medications, allergies, medical history  Relevant Medical History:  n/a   Objective  (SKPE) Well appearing patient in no apparent distress; mood and affect are within normal limits. Examination was performed of the: Focused Exam of: face   Examination notable for:  Lentigo/lentigines: Scattered pigmented macules that are tan to brown in color and are somewhat non-uniform in shape and concentrated in the sun-exposed areas of the trunk and upper extremities - Nevus/nevi: Scattered well-demarcated, regular, pigmented macule(s) and/or papule(s) on the scattered diffusely - Seborrheic Keratosis(es): Stuck-on appearing keratotic papule(s) on the trunk, none  irritated with redness, crusting, edema, and/or partial avulsion Examination limited by: Clothing and Patient deferred removal     Right mandible 5mm skin colored papule  Left Nasal Bridge 3mm pink tan papule   Assessment & Plan  (SKAP)   BENIGN SKIN FINDINGS  - Lentigines  - Nevus/Multiple Benign Nevi - Reassurance provided regarding the benign appearance of lesions noted on exam today; no treatment is indicated in the absence of symptoms/changes. - Reinforced importance of photoprotective strategies including liberal and frequent sunscreen use of a broad-spectrum SPF 30 or greater, use of protective clothing, and sun avoidance for prevention of cutaneous malignancy and photoaging.  Counseled patient on the importance of regular self-skin monitoring as well as routine clinical skin examinations as scheduled.   ACTINIC DAMAGE - Chronic condition, secondary to  cumulative UV/sun exposure - Recommend daily broad spectrum sunscreen SPF 30+ to sun-exposed areas, reapply every 2 hours as needed.  - Staying in the shade or wearing long sleeves, sun glasses (UVA+UVB protection) and wide brim hats (4-inch brim around the entire circumference of the hat) are also recommended for sun protection.  - Call for new or changing lesions.   Procedures, orders, diagnosis for this visit:  NEOPLASM OF UNCERTAIN BEHAVIOR (2) Right mandible - Epidermal / dermal shaving  Lesion diameter (cm):  0.5 Informed consent: discussed and consent obtained   Timeout: patient name, date of birth, surgical site, and procedure verified   Procedure prep:  Patient was prepped and draped in usual sterile fashion Prep type:  Isopropyl alcohol Anesthesia: the lesion was anesthetized in a standard fashion   Anesthetic:  1% lidocaine w/ epinephrine 1-100,000 buffered w/ 8.4% NaHCO3 Instrument used: DermaBlade   Hemostasis achieved with: pressure and aluminum chloride   Outcome: patient tolerated procedure well   Post-procedure details: wound care instructions given    Left Nasal Bridge - Epidermal / dermal shaving  Lesion diameter (cm):  0.3 Informed consent: discussed and consent obtained   Timeout: patient name, date of birth, surgical site, and procedure verified   Procedure prep:  Patient was prepped and draped in usual sterile fashion Prep type:  Isopropyl alcohol Anesthesia: the lesion was anesthetized in a standard fashion   Anesthetic:  1% lidocaine w/ epinephrine 1-100,000 buffered w/ 8.4% NaHCO3 Instrument used: DermaBlade   Hemostasis achieved with: pressure and aluminum chloride   Outcome: patient tolerated procedure well   Post-procedure details: wound care instructions given     Neoplasm of uncertain behavior -  Epidermal / dermal shaving -     Epidermal / dermal shaving    Return to clinic: Return if symptoms worsen or fail to improve.  I, Emerick Ege, CMA am acting as scribe for Lauraine JAYSON Kanaris, MD.   Documentation: I have reviewed the above documentation for accuracy and completeness, and I agree with the above.  Lauraine JAYSON Kanaris, MD  "

## 2024-08-30 ENCOUNTER — Ambulatory Visit: Payer: Self-pay

## 2024-08-30 LAB — SURGICAL PATHOLOGY

## 2024-08-30 NOTE — Progress Notes (Signed)
Patient informed and voiced good understanding.

## 2024-09-02 ENCOUNTER — Ambulatory Visit: Payer: Self-pay | Admitting: Advanced Practice Midwife

## 2024-09-06 NOTE — Telephone Encounter (Signed)
 Spoke w/[facility]. Requested add on HPV testing. Testing added per Tamika.

## 2024-09-07 LAB — CYTOLOGY - PAP
Comment: NEGATIVE
Diagnosis: NEGATIVE
High risk HPV: NEGATIVE
